# Patient Record
Sex: Female | Born: 1990 | Race: White | Hispanic: No | Marital: Married | Smoking: Never smoker
Health system: Southern US, Community
[De-identification: ages and names within clinical notes are randomized; demographics above are authoritative.]

## PROBLEM LIST (undated history)

## (undated) ENCOUNTER — Inpatient Hospital Stay (HOSPITAL_COMMUNITY): Payer: Self-pay

## (undated) DIAGNOSIS — Z8742 Personal history of other diseases of the female genital tract: Secondary | ICD-10-CM

## (undated) HISTORY — PX: DENTAL SURGERY: SHX609

## (undated) HISTORY — PX: KNEE SURGERY: SHX244

---
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
## 2013-01-22 ENCOUNTER — Encounter (HOSPITAL_COMMUNITY): Payer: Self-pay | Admitting: Emergency Medicine

## 2013-01-22 ENCOUNTER — Emergency Department (INDEPENDENT_AMBULATORY_CARE_PROVIDER_SITE_OTHER)
Admission: EM | Admit: 2013-01-22 | Discharge: 2013-01-22 | Disposition: A | Discharge status: Home / Self Care | Payer: Self-pay | Source: Home / Self Care | Attending: Family Medicine | Admitting: Family Medicine

## 2013-01-22 DIAGNOSIS — S134XXA Sprain of ligaments of cervical spine, initial encounter: Secondary | ICD-10-CM

## 2013-01-22 DIAGNOSIS — S139XXA Sprain of joints and ligaments of unspecified parts of neck, initial encounter: Secondary | ICD-10-CM

## 2013-01-22 HISTORY — DX: Personal history of other diseases of the female genital tract: Z87.42

## 2013-01-22 MED ORDER — CYCLOBENZAPRINE HCL 10 MG PO TABS: 10.0000 mg | ORAL_TABLET | Freq: Two times a day (BID) | ORAL | Status: DC | PRN
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
## 2013-01-22 NOTE — ED Provider Notes (Signed)
Medical screening examination/treatment/procedure(s) were performed by resident physician or non-physician practitioner and as supervising physician I was immediately available for consultation/collaboration.   Kala Ambriz DOUGLAS MD.   Reniah Cottingham D Essa Wenk, MD 01/22/13 1653 
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
## 2014-04-26 ENCOUNTER — Ambulatory Visit (HOSPITAL_COMMUNITY): Payer: Self-pay

## 2015-03-31 ENCOUNTER — Ambulatory Visit (INDEPENDENT_AMBULATORY_CARE_PROVIDER_SITE_OTHER): Payer: BLUE CROSS/BLUE SHIELD

## 2015-03-31 ENCOUNTER — Ambulatory Visit (INDEPENDENT_AMBULATORY_CARE_PROVIDER_SITE_OTHER): Payer: BLUE CROSS/BLUE SHIELD | Admitting: Podiatry

## 2015-03-31 VITALS — BP 118/77 | HR 98 | Resp 16 | Ht 64.5 in | Wt 185.0 lb

## 2015-03-31 DIAGNOSIS — M79671 Pain in right foot: Secondary | ICD-10-CM | POA: Diagnosis not present

## 2015-03-31 DIAGNOSIS — M779 Enthesopathy, unspecified: Secondary | ICD-10-CM

## 2015-03-31 MED ORDER — TRIAMCINOLONE ACETONIDE 10 MG/ML IJ SUSP
10.0000 mg | Freq: Once | INTRAMUSCULAR | Status: AC
  Administered 2015-03-31: 10 mg

## 2015-03-31 MED ORDER — PREDNISONE 10 MG PO TABS: ORAL_TABLET | ORAL | Status: DC

## 2015-03-31 NOTE — Progress Notes (Signed)
   Subjective:    Patient ID: Melissa Sherman, female    DOB: 1990-05-24, 25 y.o.   MRN: [id number]  HPI Patient presents with foot pain in their right foot; lateral side, dorsal & plantar. Pt stated, "entire foot hurts".   Review of Systems  Constitutional: Positive for unexpected weight change.  All other systems reviewed and are negative.      Objective:   Physical Exam        Assessment & Plan:

## 2015-04-02 NOTE — Progress Notes (Signed)
Subjective:     Patient ID: Melissa Sherman, female   DOB: 1990/06/17, 25 y.o.   MRN: [id number]  HPI patient presents with exquisite discomfort in the right foot that's been present for several months with no history of injury. It is reaching a point where it's becoming hard for her to bear weight on it and it's hurting significantly and 2 separate areas   Review of Systems  All other systems reviewed and are negative.      Objective:   Physical Exam  Constitutional: She is oriented to person, place, and time.  Cardiovascular: Intact distal pulses.   Musculoskeletal: Normal range of motion.  Neurological: She is oriented to person, place, and time.  Skin: Skin is warm.  Nursing note and vitals reviewed.  neurovascular status is found to be intact with muscle strength adequate range of motion within normal limits but patient is splinting on the right foot due to the pain that is occurring. Patient has exquisite discomfort in the sinus tarsi right and the lateral area around the peroneal insertion base of fifth metatarsal and distal to this point. The area is swollen but there is no redness or open-type areas. Patient is noted to have good digital perfusion and is well oriented 3     Assessment:     Inflammatory condition right foot sinus tarsi and lateral foot with 2 distinct areas of pain    Plan:     H&P and x-ray reviewed with patient. Today I did sinus tarsi injection right 3 mg Kenalog 5 mg Xylocaine and injected the right lateral foot 3 mg Kenalog 5 mg Xylocaine and dispensed air fracture walker with instructions on usage and ice therapy. I also placed on sterile prep DS Dosepak and reappoint again to reevaluate in 2 weeks

## 2015-04-24 ENCOUNTER — Encounter: Payer: Self-pay | Admitting: Podiatry

## 2015-04-24 ENCOUNTER — Ambulatory Visit (INDEPENDENT_AMBULATORY_CARE_PROVIDER_SITE_OTHER): Payer: BLUE CROSS/BLUE SHIELD | Admitting: Podiatry

## 2015-04-24 VITALS — BP 102/70 | HR 96 | Resp 16

## 2015-04-24 DIAGNOSIS — M779 Enthesopathy, unspecified: Secondary | ICD-10-CM | POA: Diagnosis not present

## 2015-04-24 DIAGNOSIS — M79671 Pain in right foot: Secondary | ICD-10-CM | POA: Diagnosis not present

## 2015-04-25 NOTE — Progress Notes (Signed)
Subjective:     Patient ID: Melissa Sherman, female   DOB: [birth year], 25 y.o.   MRN: [id number]  HPI patient states the boot is helping but I'm still having some pain if I been on my foot to long. Patient states that overall the pain has improved quite a bit with immobilization and medication   Review of Systems     Objective:   Physical Exam Neurovascular status intact muscle strength adequate with diminished discomfort lateral side of the foot sinus tarsi and forefoot. Boot is been worn well and she was able to take the entire steroid pack    Assessment:     Acute inflammatory condition which has improved quite a bit with conservative treatment    Plan:     Reviewed the continuation of physical therapy and boot usage with gradual reduction over the next 4 weeks. Reappoint for us to recheck

## 2015-09-04 LAB — OB RESULTS CONSOLE RPR: RPR: NONREACTIVE

## 2015-09-04 LAB — OB RESULTS CONSOLE HEPATITIS B SURFACE ANTIGEN: HEP B S AG: NEGATIVE

## 2015-09-04 LAB — OB RESULTS CONSOLE ANTIBODY SCREEN: ANTIBODY SCREEN: NEGATIVE

## 2015-09-04 LAB — OB RESULTS CONSOLE RUBELLA ANTIBODY, IGM: RUBELLA: IMMUNE

## 2015-09-04 LAB — OB RESULTS CONSOLE ABO/RH: RH Type: POSITIVE

## 2015-09-04 LAB — OB RESULTS CONSOLE HIV ANTIBODY (ROUTINE TESTING): HIV: NONREACTIVE

## 2015-09-22 LAB — OB RESULTS CONSOLE GC/CHLAMYDIA
CHLAMYDIA, DNA PROBE: NEGATIVE
GC PROBE AMP, GENITAL: NEGATIVE

## 2015-10-01 ENCOUNTER — Encounter (HOSPITAL_COMMUNITY): Payer: Self-pay

## 2015-10-01 ENCOUNTER — Inpatient Hospital Stay (HOSPITAL_COMMUNITY)
Admission: AD | Admit: 2015-10-01 | Discharge: 2015-10-01 | Disposition: A | Discharge status: Home / Self Care | Payer: BLUE CROSS/BLUE SHIELD | Source: Ambulatory Visit | Attending: Obstetrics and Gynecology | Admitting: Obstetrics and Gynecology

## 2015-10-01 DIAGNOSIS — R197 Diarrhea, unspecified: Secondary | ICD-10-CM | POA: Diagnosis present

## 2015-10-01 DIAGNOSIS — R110 Nausea: Secondary | ICD-10-CM | POA: Insufficient documentation

## 2015-10-01 DIAGNOSIS — R531 Weakness: Secondary | ICD-10-CM | POA: Insufficient documentation

## 2015-10-01 DIAGNOSIS — O210 Mild hyperemesis gravidarum: Secondary | ICD-10-CM | POA: Diagnosis present

## 2015-10-01 DIAGNOSIS — O99281 Endocrine, nutritional and metabolic diseases complicating pregnancy, first trimester: Secondary | ICD-10-CM | POA: Diagnosis not present

## 2015-10-01 DIAGNOSIS — O9989 Other specified diseases and conditions complicating pregnancy, childbirth and the puerperium: Secondary | ICD-10-CM | POA: Insufficient documentation

## 2015-10-01 DIAGNOSIS — O26899 Other specified pregnancy related conditions, unspecified trimester: Secondary | ICD-10-CM

## 2015-10-01 DIAGNOSIS — Z3A12 12 weeks gestation of pregnancy: Secondary | ICD-10-CM | POA: Diagnosis not present

## 2015-10-01 DIAGNOSIS — E282 Polycystic ovarian syndrome: Secondary | ICD-10-CM | POA: Insufficient documentation

## 2015-10-01 LAB — URINE MICROSCOPIC-ADD ON

## 2015-10-01 LAB — CBC
HEMATOCRIT: 35.1 % — AB (ref 36.0–46.0)
Hemoglobin: 12.9 g/dL (ref 12.0–15.0)
MCH: 30.9 pg (ref 26.0–34.0)
MCHC: 36.8 g/dL — ABNORMAL HIGH (ref 30.0–36.0)
MCV: 84.2 fL (ref 78.0–100.0)
Platelets: 255 10*3/uL (ref 150–400)
RBC: 4.17 MIL/uL (ref 3.87–5.11)
RDW: 12.3 % (ref 11.5–15.5)
WBC: 8.9 10*3/uL (ref 4.0–10.5)

## 2015-10-01 LAB — COMPREHENSIVE METABOLIC PANEL
ALT: 14 U/L (ref 14–54)
AST: 16 U/L (ref 15–41)
Albumin: 3.6 g/dL (ref 3.5–5.0)
Alkaline Phosphatase: 36 U/L — ABNORMAL LOW (ref 38–126)
Anion gap: 6 (ref 5–15)
BILIRUBIN TOTAL: 0.2 mg/dL — AB (ref 0.3–1.2)
BUN: 6 mg/dL (ref 6–20)
CALCIUM: 9.1 mg/dL (ref 8.9–10.3)
CO2: 24 mmol/L (ref 22–32)
CREATININE: 0.6 mg/dL (ref 0.44–1.00)
Chloride: 103 mmol/L (ref 101–111)
Glucose, Bld: 77 mg/dL (ref 65–99)
Potassium: 4 mmol/L (ref 3.5–5.1)
Sodium: 133 mmol/L — ABNORMAL LOW (ref 135–145)
TOTAL PROTEIN: 6.7 g/dL (ref 6.5–8.1)

## 2015-10-01 LAB — URINALYSIS, ROUTINE W REFLEX MICROSCOPIC
BILIRUBIN URINE: NEGATIVE
Glucose, UA: NEGATIVE mg/dL
HGB URINE DIPSTICK: NEGATIVE
KETONES UR: NEGATIVE mg/dL
NITRITE: NEGATIVE
PH: 5.5 (ref 5.0–8.0)
Protein, ur: NEGATIVE mg/dL
SPECIFIC GRAVITY, URINE: 1.01 (ref 1.005–1.030)

## 2015-10-01 MED ORDER — LACTATED RINGERS IV BOLUS (SEPSIS)
1000.0000 mL | Freq: Once | INTRAVENOUS | Status: AC
  Administered 2015-10-01: 1000 mL via INTRAVENOUS

## 2015-10-01 NOTE — Discharge Instructions (Signed)
Diarrhea  Diarrhea is watery poop (stool). It can make you feel weak, tired, thirsty, or give you a dry mouth (signs of dehydration). Watery poop is a sign of another problem, most often an infection. It often lasts 2-3 days. It can last longer if it is a sign of something serious. Take care of yourself as told by your doctor.  HOME CARE   · Drink 1 cup (8 ounces) of fluid each time you have watery poop.  · Do not drink the following fluids:    Those that contain simple sugars (fructose, glucose, galactose, lactose, sucrose, maltose).    Sports drinks.    Fruit juices.    Whole milk products.    Sodas.    Drinks with caffeine (coffee, tea, soda) or alcohol.  · Oral rehydration solution may be used if the doctor says it is okay. You may make your own solution. Follow this recipe:    ?-? teaspoon table salt.    ¾ teaspoon baking soda.    ? teaspoon salt substitute containing potassium chloride.    1 ? tablespoons sugar.    1 liter (34 ounces) of water.  · Avoid the following foods:    High fiber foods, such as raw fruits and vegetables.    Nuts, seeds, and whole grain breads and cereals.     Those that are sweetened with sugar alcohols (xylitol, sorbitol, mannitol).  · Try eating the following foods:    Starchy foods, such as rice, toast, pasta, low-sugar cereal, oatmeal, baked potatoes, crackers, and bagels.    Bananas.    Applesauce.  · Eat probiotic-rich foods, such as yogurt and milk products that are fermented.  · Wash your hands well after each time you have watery poop.  · Only take medicine as told by your doctor.  · Take a warm bath to help lessen burning or pain from having watery poop.  GET HELP RIGHT AWAY IF:   · You cannot drink fluids without throwing up (vomiting).  · You keep throwing up.  · You have blood in your poop, or your poop looks black and tarry.  · You do not pee (urinate) in 6-8 hours, or there is only a small amount of very dark pee.  · You have belly (abdominal) pain that gets worse or  stays in the same spot (localizes).  · You are weak, dizzy, confused, or light-headed.  · You have a very bad headache.  · Your watery poop gets worse or does not get better.  · You have a fever or lasting symptoms for more than 2-3 days.  · You have a fever and your symptoms suddenly get worse.  MAKE SURE YOU:   · Understand these instructions.  · Will watch your condition.  · Will get help right away if you are not doing well or get worse.     This information is not intended to replace advice given to you by your health care provider. Make sure you discuss any questions you have with your health care provider.     Document Released: 07/10/2007 Document Revised: 02/11/2014 Document Reviewed: 09/29/2011  Elsevier Interactive Patient Education ©2016 [employer]

## 2015-10-01 NOTE — MAU Note (Signed)
Pt presents to MAU with diarrhea and nausea. Symptoms started yesterday. Pt has not been able to eat much today. Has been taking Imodium and Tums, and has helped.

## 2015-10-01 NOTE — MAU Provider Note (Signed)
History     CSN: [id number]  Arrival date and time: 10/01/15 1403   First Provider Initiated Contact with Patient 10/01/15 1453       Chief Complaint  Patient presents with  . Diarrhea  . Fatigue  . Morning Sickness   Melissa Sherman is a 25 y.o. G1P0 at [redacted]w[redacted]d who presents with nausea, diarrhea, and weakness. Symptoms began Friday. Denies fever, vomiting, sick contacts, recent hospitalization or antibiotic usage, vaginal bleeding. Woke up with sharp abdominal pain Friday morning prior to diarrhea; pain has resolved since then.    Diarrhea   This is a new problem. Episode onset: 2 days ago. Episode frequency: 9 times yesterday, once today. The problem has been rapidly improving. The stool consistency is described as watery. The patient states that diarrhea does not awaken her from sleep. Associated symptoms include abdominal pain (on Friday; abdominal soreness today). Pertinent negatives include no bloating, chills, fever, increased  flatus, myalgias or vomiting. Nothing aggravates the symptoms. There are no known risk factors. Treatments tried: imodium. The treatment provided moderate relief. There is no history of bowel resection, inflammatory bowel disease, irritable bowel syndrome or a recent abdominal surgery.     OB History    Gravida Para Term Preterm AB Living   1             SAB TAB Ectopic Multiple Live Births                  Past Medical History:  Diagnosis Date  . History of PCOS     Past Surgical History:  Procedure Laterality Date  . DENTAL SURGERY    . KNEE SURGERY      No family history on file.  Social History  Substance Use Topics  . Smoking status: Never Smoker  . Smokeless tobacco: Never Used  . Alcohol use No    Allergies:  Allergies  Allergen Reactions  . Nsaids   . Diphenhydramine Swelling    Other reaction(s): Hives  . Penicillins Other (See Comments)    Stomach pains    Prescriptions Prior to Admission  Medication Sig Dispense  Refill Last Dose  . Loperamide HCl (IMODIUM PO) Take 1 tablet by mouth.   10/01/2015 at 0700  . Prenatal Vit-Fe Fumarate-FA (MULTIVITAMIN-PRENATAL) 27-0.8 MG TABS tablet Take 1 tablet by mouth daily at 12 noon.   09/30/2015 at Unknown time  . valACYclovir (VALTREX) 1000 MG tablet    Past Week at Unknown time    Review of Systems  Constitutional: Negative for chills and fever.  Gastrointestinal: Positive for abdominal pain (on Friday; abdominal soreness today), diarrhea and nausea. Negative for bloating, blood in stool, flatus, melena and vomiting.  Genitourinary: Negative.  Negative for dysuria and frequency.  Musculoskeletal: Negative for myalgias.  Neurological: Positive for weakness. Negative for dizziness.   Physical Exam   Blood pressure (!) 103/54, pulse 75, temperature 99.2 F (37.3 C), temperature source Oral, resp. rate 18, height 5\' 5"  (1.651 m), weight 174 lb (78.9 kg), last menstrual period 07/08/2015.  Physical Exam  Nursing note and vitals reviewed. Constitutional: She is oriented to person, place, and time. She appears well-developed and well-nourished. No distress.  HENT:  Head: Normocephalic and atraumatic.  Eyes: Conjunctivae are normal. Right eye exhibits no discharge. Left eye exhibits no discharge. No scleral icterus.  Neck: Normal range of motion.  Cardiovascular: Normal rate, regular rhythm and normal heart sounds.   No murmur heard. Respiratory: Effort normal and breath sounds normal.  No respiratory distress. She has no wheezes.  GI: Soft. Bowel sounds are normal. She exhibits no distension. There is no tenderness. There is no rebound and no guarding.  Neurological: She is alert and oriented to person, place, and time.  Skin: Skin is warm and dry. She is not diaphoretic.  Psychiatric: She has a normal mood and affect. Her behavior is normal. Judgment and thought content normal.    MAU Course  Procedures Results for orders placed or performed during the  hospital encounter of 10/01/15 (from the past 24 hour(s))  Urinalysis, Routine w reflex microscopic ([facility])     Status: Abnormal   Collection Time: 10/01/15  2:15 PM  Result Value Ref Range   Color, Urine YELLOW YELLOW   APPearance CLEAR CLEAR   Specific Gravity, Urine 1.010 1.005 - 1.030   pH 5.5 5.0 - 8.0   Glucose, UA NEGATIVE NEGATIVE mg/dL   Hgb urine dipstick NEGATIVE NEGATIVE   Bilirubin Urine NEGATIVE NEGATIVE   Ketones, ur NEGATIVE NEGATIVE mg/dL   Protein, ur NEGATIVE NEGATIVE mg/dL   Nitrite NEGATIVE NEGATIVE   Leukocytes, UA TRACE (A) NEGATIVE  Urine microscopic-add on     Status: Abnormal   Collection Time: 10/01/15  2:15 PM  Result Value Ref Range   Squamous Epithelial / LPF 0-5 (A) NONE SEEN   WBC, UA 0-5 0 - 5 WBC/hpf   RBC / HPF 0-5 0 - 5 RBC/hpf   Bacteria, UA FEW (A) NONE SEEN  CBC     Status: Abnormal   Collection Time: 10/01/15  3:06 PM  Result Value Ref Range   WBC 8.9 4.0 - 10.5 K/uL   RBC 4.17 3.87 - 5.11 MIL/uL   Hemoglobin 12.9 12.0 - 15.0 g/dL   HCT 16.135.1 (L) 09.636.0 - 04.546.0 %   MCV 84.2 78.0 - 100.0 fL   MCH 30.9 26.0 - 34.0 pg   MCHC 36.8 (H) 30.0 - 36.0 g/dL   RDW 40.912.3 81.111.5 - 91.415.5 %   Platelets 255 150 - 400 K/uL  Comprehensive metabolic panel     Status: Abnormal   Collection Time: 10/01/15  3:06 PM  Result Value Ref Range   Sodium 133 (L) 135 - 145 mmol/L   Potassium 4.0 3.5 - 5.1 mmol/L   Chloride 103 101 - 111 mmol/L   CO2 24 22 - 32 mmol/L   Glucose, Bld 77 65 - 99 mg/dL   BUN 6 6 - 20 mg/dL   Creatinine, Ser 7.820.60 0.44 - 1.00 mg/dL   Calcium 9.1 8.9 - 95.610.3 mg/dL   Total Protein 6.7 6.5 - 8.1 g/dL   Albumin 3.6 3.5 - 5.0 g/dL   AST 16 15 - 41 U/L   ALT 14 14 - 54 U/L   Alkaline Phosphatase 36 (L) 38 - 126 U/L   Total Bilirubin 0.2 (L) 0.3 - 1.2 mg/dL   GFR calc non Af Amer >60 >60 mL/min   GFR calc Af Amer >60 >60 mL/min   Anion gap 6 5 - 15    MDM FHT 152 by doppler IV fluid bolus, LR CBC, CMP VSS S/w Dr. Renaldo FiddlerAdkins  regarding patient - ok to discharge home. D/c imodium Assessment and Plan  A: 1. Diarrhea, unspecified type   2. Pregnancy related nausea, antepartum     P: Discharge home Stop taking imodium unless diarrhea returns Keep f/u with OB Discussed appropriate diet for diarrhea  Judeth Hornrin Quinta Eimer 10/01/2015, 2:52 PM

## 2016-02-05 NOTE — L&D Delivery Note (Signed)
Delivery Note At 10:35 PM a viable female was delivered via Vaginal, Spontaneous Delivery (Presentation: ;  ).  APGAR: 7, 9; weight  .   Placenta statusspont>>intact: , .  Cord:  with the following complications: TRUR KNOT X 1.  Cord pH: not sent  Anesthesia:  epid + local Episiotomy:  none Lacerations:  Sec deg Suture Repair: 3.0 vicryl rapide Est. Blood Loss (mL):  200  Mom to postpartum.  Baby to Couplet care / Skin to Skin.  Nihar Klus M 04/08/2016, 11:01 PM

## 2016-03-08 LAB — OB RESULTS CONSOLE GBS: GBS: NEGATIVE

## 2016-04-08 ENCOUNTER — Inpatient Hospital Stay (HOSPITAL_COMMUNITY)
Admission: AD | Admit: 2016-04-08 | Discharge: 2016-04-10 | DRG: 775 | Disposition: A | Discharge status: Home / Self Care | Payer: BLUE CROSS/BLUE SHIELD | Source: Ambulatory Visit | Attending: Obstetrics and Gynecology | Admitting: Obstetrics and Gynecology

## 2016-04-08 ENCOUNTER — Inpatient Hospital Stay (HOSPITAL_COMMUNITY): Payer: BLUE CROSS/BLUE SHIELD | Admitting: Anesthesiology

## 2016-04-08 ENCOUNTER — Encounter (HOSPITAL_COMMUNITY): Payer: Self-pay

## 2016-04-08 DIAGNOSIS — Z3490 Encounter for supervision of normal pregnancy, unspecified, unspecified trimester: Secondary | ICD-10-CM

## 2016-04-08 DIAGNOSIS — Z3493 Encounter for supervision of normal pregnancy, unspecified, third trimester: Secondary | ICD-10-CM | POA: Diagnosis present

## 2016-04-08 DIAGNOSIS — Z3A40 40 weeks gestation of pregnancy: Secondary | ICD-10-CM | POA: Diagnosis not present

## 2016-04-08 LAB — CBC
HCT: 33.2 % — ABNORMAL LOW (ref 36.0–46.0)
Hemoglobin: 11.6 g/dL — ABNORMAL LOW (ref 12.0–15.0)
MCH: 30.3 pg (ref 26.0–34.0)
MCHC: 34.9 g/dL (ref 30.0–36.0)
MCV: 86.7 fL (ref 78.0–100.0)
PLATELETS: 295 10*3/uL (ref 150–400)
RBC: 3.83 MIL/uL — ABNORMAL LOW (ref 3.87–5.11)
RDW: 13.4 % (ref 11.5–15.5)
WBC: 12 10*3/uL — AB (ref 4.0–10.5)

## 2016-04-08 LAB — TYPE AND SCREEN
ABO/RH(D): A POS
ANTIBODY SCREEN: NEGATIVE

## 2016-04-08 LAB — ABO/RH: ABO/RH(D): A POS

## 2016-04-08 MED ORDER — LACTATED RINGERS IV SOLN: 500.0000 mL | INTRAVENOUS | Status: DC | PRN

## 2016-04-08 MED ORDER — LACTATED RINGERS IV SOLN
500.0000 mL | Freq: Once | INTRAVENOUS | Status: AC
  Administered 2016-04-08: 500 mL via INTRAVENOUS

## 2016-04-08 MED ORDER — FLEET ENEMA 7-19 GM/118ML RE ENEM: 1.0000 | ENEMA | RECTAL | Status: DC | PRN

## 2016-04-08 MED ORDER — OXYTOCIN 40 UNITS IN LACTATED RINGERS INFUSION - SIMPLE MED: 2.5000 [IU]/h | INTRAVENOUS | Status: DC

## 2016-04-08 MED ORDER — BUTORPHANOL TARTRATE 1 MG/ML IJ SOLN: 1.0000 mg | Freq: Once | INTRAMUSCULAR | Status: DC

## 2016-04-08 MED ORDER — LIDOCAINE HCL (PF) 1 % IJ SOLN: 30.0000 mL | INTRAMUSCULAR | Status: DC | PRN

## 2016-04-08 MED ORDER — LACTATED RINGERS IV SOLN
INTRAVENOUS | Status: DC
  Administered 2016-04-08: 14:00:00 via INTRAVENOUS

## 2016-04-08 MED ORDER — LIDOCAINE HCL (PF) 1 % IJ SOLN
INTRAMUSCULAR | Status: DC | PRN
  Administered 2016-04-08 (×2): 4 mL via EPIDURAL

## 2016-04-08 MED ORDER — OXYTOCIN BOLUS FROM INFUSION: 500.0000 mL | Freq: Once | INTRAVENOUS | Status: DC

## 2016-04-08 MED ORDER — EPHEDRINE 5 MG/ML INJ
10.0000 mg | INTRAVENOUS | Status: DC | PRN
  Filled 2016-04-08: qty 4

## 2016-04-08 MED ORDER — OXYCODONE-ACETAMINOPHEN 5-325 MG PO TABS: 2.0000 | ORAL_TABLET | ORAL | Status: DC | PRN

## 2016-04-08 MED ORDER — ONDANSETRON HCL 4 MG/2ML IJ SOLN: 4.0000 mg | Freq: Four times a day (QID) | INTRAMUSCULAR | Status: DC | PRN

## 2016-04-08 MED ORDER — OXYCODONE-ACETAMINOPHEN 5-325 MG PO TABS: 1.0000 | ORAL_TABLET | ORAL | Status: DC | PRN

## 2016-04-08 MED ORDER — BUTORPHANOL TARTRATE 1 MG/ML IJ SOLN
INTRAMUSCULAR | Status: AC
  Administered 2016-04-08: 1 mg
  Filled 2016-04-08: qty 1

## 2016-04-08 MED ORDER — LIDOCAINE HCL (PF) 1 % IJ SOLN
30.0000 mL | INTRAMUSCULAR | Status: DC | PRN
  Administered 2016-04-08: 30 mL via SUBCUTANEOUS
  Filled 2016-04-08: qty 30

## 2016-04-08 MED ORDER — PHENYLEPHRINE 40 MCG/ML (10ML) SYRINGE FOR IV PUSH (FOR BLOOD PRESSURE SUPPORT)
80.0000 ug | PREFILLED_SYRINGE | INTRAVENOUS | Status: DC | PRN
  Filled 2016-04-08: qty 5

## 2016-04-08 MED ORDER — ACETAMINOPHEN 325 MG PO TABS: 650.0000 mg | ORAL_TABLET | ORAL | Status: DC | PRN

## 2016-04-08 MED ORDER — LACTATED RINGERS IV SOLN: INTRAVENOUS | Status: DC

## 2016-04-08 MED ORDER — SOD CITRATE-CITRIC ACID 500-334 MG/5ML PO SOLN: 30.0000 mL | ORAL | Status: DC | PRN

## 2016-04-08 MED ORDER — FENTANYL 2.5 MCG/ML BUPIVACAINE 1/10 % EPIDURAL INFUSION (WH - ANES)
14.0000 mL/h | INTRAMUSCULAR | Status: DC | PRN
  Administered 2016-04-08 (×2): 14 mL/h via EPIDURAL
  Filled 2016-04-08 (×2): qty 100

## 2016-04-08 MED ORDER — OXYTOCIN 40 UNITS IN LACTATED RINGERS INFUSION - SIMPLE MED
2.5000 [IU]/h | INTRAVENOUS | Status: DC
  Administered 2016-04-08: 2.5 [IU]/h via INTRAVENOUS
  Filled 2016-04-08: qty 1000

## 2016-04-08 MED ORDER — ACETAMINOPHEN 325 MG PO TABS
650.0000 mg | ORAL_TABLET | ORAL | Status: DC | PRN
  Administered 2016-04-08: 650 mg via ORAL
  Filled 2016-04-08 (×2): qty 2

## 2016-04-08 MED ORDER — OXYTOCIN BOLUS FROM INFUSION
500.0000 mL | Freq: Once | INTRAVENOUS | Status: AC
  Administered 2016-04-08: 500 mL via INTRAVENOUS

## 2016-04-08 MED ORDER — PHENYLEPHRINE 40 MCG/ML (10ML) SYRINGE FOR IV PUSH (FOR BLOOD PRESSURE SUPPORT)
80.0000 ug | PREFILLED_SYRINGE | INTRAVENOUS | Status: DC | PRN
  Filled 2016-04-08: qty 10
  Filled 2016-04-08: qty 5

## 2016-04-08 NOTE — Anesthesia Preprocedure Evaluation (Signed)
Anesthesia Evaluation  Patient identified by MRN, date of birth, ID band Patient awake    Reviewed: Allergy & Precautions, Patient's Chart, lab work & pertinent test results  Airway Mallampati: III       Dental no notable dental hx. (+) Teeth Intact   Pulmonary neg pulmonary ROS,    Pulmonary exam normal breath sounds clear to auscultation       Cardiovascular negative cardio ROS Normal cardiovascular exam Rhythm:Regular Rate:Normal     Neuro/Psych negative neurological ROS  negative psych ROS   GI/Hepatic Neg liver ROS, GERD  ,  Endo/Other  Morbid obesityHx/o PCOS  Renal/GU negative Renal ROS  negative genitourinary   Musculoskeletal negative musculoskeletal ROS (+)   Abdominal (+) + obese,   Peds  Hematology  (+) anemia ,   Anesthesia Other Findings   Reproductive/Obstetrics                             Anesthesia Physical Anesthesia Plan  ASA: III  Anesthesia Plan: Epidural   Post-op Pain Management:    Induction:   Airway Management Planned: Natural Airway  Additional Equipment:   Intra-op Plan:   Post-operative Plan:   Informed Consent: I have reviewed the patients History and Physical, chart, labs and discussed the procedure including the risks, benefits and alternatives for the proposed anesthesia with the patient or authorized representative who has indicated his/her understanding and acceptance.     Plan Discussed with: Anesthesiologist  Anesthesia Plan Comments:         Anesthesia Quick Evaluation

## 2016-04-08 NOTE — MAU Note (Signed)
Patient reports to mau with c/o contractions; was sent over from the office; she endorses being told she was 4-5 cm/80%. Rating pain 10/10--wants an epidural. GBS Neg. Denies LOF, VB at this time. +FM

## 2016-04-08 NOTE — Progress Notes (Signed)
6-7/ AROM>.thin mec, FHR cat I, comft w./ epid

## 2016-04-08 NOTE — H&P (Signed)
Melissa Sherman  DICTATION # [id number] CSN# [id number]   [name],[name], MD 04/08/2016 1:14 PM

## 2016-04-08 NOTE — Anesthesia Procedure Notes (Signed)
Epidural Patient location during procedure: OB Start time: 04/08/2016 2:19 PM  Staffing Anesthesiologist: Mal AmabileFOSTER, Rakiyah Esch Performed: anesthesiologist   Preanesthetic Checklist Completed: patient identified, site marked, surgical consent, pre-op evaluation, timeout performed, IV checked, risks and benefits discussed and monitors and equipment checked  Epidural Patient position: sitting Prep: site prepped and draped and DuraPrep Patient monitoring: continuous pulse ox and blood pressure Approach: midline Location: L3-L4 Injection technique: LOR air  Needle:  Needle type: Tuohy  Needle gauge: 17 G Needle length: 9 cm and 9 Needle insertion depth: 9 cm Catheter type: closed end flexible Catheter size: 19 Gauge Catheter at skin depth: 14 cm Test dose: negative and Other  Assessment Events: blood not aspirated, injection not painful, no injection resistance, negative IV test and no paresthesia  Additional Notes Patient identified. Risks and benefits discussed including failed block, incomplete  Pain control, post dural puncture headache, nerve damage, paralysis, blood pressure Changes, nausea, vomiting, reactions to medications-both toxic and allergic and post Partum back pain. All questions were answered. Patient expressed understanding and wished to proceed. Sterile technique was used throughout procedure. Epidural site was Dressed with sterile barrier dressing. No paresthesias, signs of intravascular injection Or signs of intrathecal spread were encountered.  Patient was more comfortable after the epidural was dosed. Please see RN's note for documentation of vital signs and FHR which are stable.

## 2016-04-08 NOTE — H&P (Signed)
NAMJefm Sherman:  Willhelm, RAHCEL                ACCOUNT NO.:  [id number]  MEDICAL RECORD NO.:  [id number]  LOCATION:                                 FACILITY:  PHYSICIAN:  Duke Salviaichard M. Marcelle OverlieHolland, M.D.    DATE OF BIRTH:  DATE OF ADMISSION:  04/08/2016 DATE OF DISCHARGE:                             HISTORY & PHYSICAL   CHIEF COMPLAINT:  Labor at term.  HISTORY OF PRESENT ILLNESS:  A [age] year old G1, P0, seen in the office today, was noted to be 4, 80%, -2, after having a closed cervix last week.  GBS is negative.  Presents now in active labor. Blood type is A positive.  PAST MEDICAL HISTORY:  ALLERGIES:  Penicillin, NSAIDs.  REVIEW OF SYSTEMS:  Significant for history of PCOS.  For remainder of her family and social history, please see the [facility] form for details.  PHYSICAL EXAMINATION:  VITAL SIGNS:  Temp 98.2, blood pressure 120/78. HEENT:  Unremarkable. NECK:  Supple without masses. LUNGS:  Clear. CARDIOVASCULAR:  Regular rate and rhythm.  No murmurs, rubs, gallops. BREASTS:  Not examined. GU:  Term fundal height.  Fetal heart rate was 140.  Cervix was 4-5, 80%, vertex, -2.  PLAN:  We will admit for labor and request epidural.     Duke Salviaichard M. Marcelle OverlieHolland, M.D.   ______________________________ Duke Salviaichard M. Marcelle OverlieHolland, M.D.    [facility]MED[facility]  D:  04/08/2016  T:  04/08/2016  Job:  [id number]

## 2016-04-09 ENCOUNTER — Encounter (HOSPITAL_COMMUNITY): Payer: Self-pay

## 2016-04-09 LAB — CBC
HEMATOCRIT: 30.6 % — AB (ref 36.0–46.0)
Hemoglobin: 10.7 g/dL — ABNORMAL LOW (ref 12.0–15.0)
MCH: 30.4 pg (ref 26.0–34.0)
MCHC: 35 g/dL (ref 30.0–36.0)
MCV: 86.9 fL (ref 78.0–100.0)
Platelets: 252 10*3/uL (ref 150–400)
RBC: 3.52 MIL/uL — ABNORMAL LOW (ref 3.87–5.11)
RDW: 13.7 % (ref 11.5–15.5)
WBC: 16.4 10*3/uL — ABNORMAL HIGH (ref 4.0–10.5)

## 2016-04-09 LAB — RPR: RPR Ser Ql: NONREACTIVE

## 2016-04-09 MED ORDER — MEASLES, MUMPS & RUBELLA VAC ~~LOC~~ INJ
0.5000 mL | INJECTION | Freq: Once | SUBCUTANEOUS | Status: DC
  Filled 2016-04-09: qty 0.5

## 2016-04-09 MED ORDER — SIMETHICONE 80 MG PO CHEW: 80.0000 mg | CHEWABLE_TABLET | ORAL | Status: DC | PRN

## 2016-04-09 MED ORDER — ONDANSETRON HCL 4 MG/2ML IJ SOLN: 4.0000 mg | INTRAMUSCULAR | Status: DC | PRN

## 2016-04-09 MED ORDER — BENZOCAINE-MENTHOL 20-0.5 % EX AERO
1.0000 "application " | INHALATION_SPRAY | CUTANEOUS | Status: DC | PRN
  Administered 2016-04-09 – 2016-04-10 (×2): 1 via TOPICAL
  Filled 2016-04-09 (×2): qty 56

## 2016-04-09 MED ORDER — OXYCODONE-ACETAMINOPHEN 5-325 MG PO TABS
2.0000 | ORAL_TABLET | ORAL | Status: DC | PRN
  Administered 2016-04-09: 2 via ORAL
  Filled 2016-04-09: qty 2

## 2016-04-09 MED ORDER — IBUPROFEN 800 MG PO TABS: 800.0000 mg | ORAL_TABLET | Freq: Three times a day (TID) | ORAL | Status: DC | PRN

## 2016-04-09 MED ORDER — ONDANSETRON HCL 4 MG PO TABS: 4.0000 mg | ORAL_TABLET | ORAL | Status: DC | PRN

## 2016-04-09 MED ORDER — BISACODYL 10 MG RE SUPP: 10.0000 mg | Freq: Every day | RECTAL | Status: DC | PRN

## 2016-04-09 MED ORDER — DIBUCAINE 1 % RE OINT: 1.0000 "application " | TOPICAL_OINTMENT | RECTAL | Status: DC | PRN

## 2016-04-09 MED ORDER — DIPHENHYDRAMINE HCL 25 MG PO CAPS: 25.0000 mg | ORAL_CAPSULE | Freq: Four times a day (QID) | ORAL | Status: DC | PRN

## 2016-04-09 MED ORDER — ACETAMINOPHEN 325 MG PO TABS
650.0000 mg | ORAL_TABLET | ORAL | Status: DC | PRN
  Administered 2016-04-09: 650 mg via ORAL

## 2016-04-09 MED ORDER — PRENATAL MULTIVITAMIN CH
1.0000 | ORAL_TABLET | Freq: Every day | ORAL | Status: DC
  Administered 2016-04-09 – 2016-04-10 (×2): 1 via ORAL
  Filled 2016-04-09 (×2): qty 1

## 2016-04-09 MED ORDER — FLEET ENEMA 7-19 GM/118ML RE ENEM: 1.0000 | ENEMA | Freq: Every day | RECTAL | Status: DC | PRN

## 2016-04-09 MED ORDER — WITCH HAZEL-GLYCERIN EX PADS
1.0000 | MEDICATED_PAD | CUTANEOUS | Status: DC | PRN
Start: 2016-04-09 — End: 2016-04-10

## 2016-04-09 MED ORDER — TETANUS-DIPHTH-ACELL PERTUSSIS 5-2.5-18.5 LF-MCG/0.5 IM SUSP: 0.5000 mL | Freq: Once | INTRAMUSCULAR | Status: DC

## 2016-04-09 MED ORDER — SENNOSIDES-DOCUSATE SODIUM 8.6-50 MG PO TABS
2.0000 | ORAL_TABLET | ORAL | Status: DC
  Administered 2016-04-10: 2 via ORAL
  Filled 2016-04-09: qty 2

## 2016-04-09 MED ORDER — OXYCODONE-ACETAMINOPHEN 5-325 MG PO TABS
1.0000 | ORAL_TABLET | ORAL | Status: DC | PRN
  Administered 2016-04-09 – 2016-04-10 (×4): 1 via ORAL
  Filled 2016-04-09 (×5): qty 1

## 2016-04-09 MED ORDER — COCONUT OIL OIL
1.0000 "application " | TOPICAL_OIL | Status: DC | PRN
  Filled 2016-04-09: qty 120

## 2016-04-09 MED ORDER — ZOLPIDEM TARTRATE 5 MG PO TABS: 5.0000 mg | ORAL_TABLET | Freq: Every evening | ORAL | Status: DC | PRN

## 2016-04-09 NOTE — Anesthesia Postprocedure Evaluation (Signed)
Anesthesia Post Note  Patient: [name]  Procedure(s) Performed: * No procedures listed *  Patient location during evaluation: Mother Baby Anesthesia Type: Epidural Level of consciousness: awake and alert and oriented Pain management: pain level controlled Vital Signs Assessment: post-procedure vital signs reviewed and stable Respiratory status: spontaneous breathing and nonlabored ventilation Cardiovascular status: stable Postop Assessment: no headache, epidural receding, patient able to bend at knees, adequate PO intake, no backache and no signs of nausea or vomiting Anesthetic complications: no        Last Vitals:  Vitals:   04/09/16 0200 04/09/16 0557  BP: 114/63 131/75  Pulse: 85 77  Resp: 18 18  Temp: 36.9 C 36.6 C    Last Pain:  Vitals:   04/09/16 0810  TempSrc:   PainSc: 3    Pain Goal: Patients Stated Pain Goal: 0 (04/08/16 1246)               Laban EmperorMalinova,Lazlo Tunney Hristova

## 2016-04-09 NOTE — Lactation Note (Signed)
This note was copied from a baby's chart. Lactation Consultation Note; Mom asked for assist with latch. Reports LC came in at 6 am but she was too sleepy to really understand all she said and she really wants this to work out. Mom reports she has been doing skin to skin but baby hasn't really latched. Previous LC gave her a NS. Baby waking so offered assist with latch at this time. Mom agreeable. Unwrapped baby, assisted mom with football hold. Nipples are flat but areola compressible.  We were able to latch baby without NS. She was on and off the breast some but mom states this is the best she has done. Mom reports no pain with latch. Mom still trying as I left room. Encouragement given. Encouraged to rest whenever baby rests. No questions at present.   Patient Name: Melissa Sherman RUEAV'WToday's Date: 04/09/2016 Reason for consult: Follow-up assessment   Maternal Data Formula Feeding for Exclusion: No Has patient been taught Hand Expression?: Yes Does the patient have breastfeeding experience prior to this delivery?: No  Feeding Feeding Type: Breast Fed  LATCH Score/Interventions Latch: Repeated attempts needed to sustain latch, nipple held in mouth throughout feeding, stimulation needed to elicit sucking reflex.  Audible Swallowing: A few with stimulation  Type of Nipple: Flat  Comfort (Breast/Nipple): Soft / non-tender     Hold (Positioning): Assistance needed to correctly position infant at breast and maintain latch. Intervention(s): Breastfeeding basics reviewed  LATCH Score: 6  Lactation Tools Discussed/Used Tools: Nipple Dorris CarnesShields;Pump Nipple shield size: 16 Breast pump type: Double-Electric Breast Pump WIC Program: No   Consult Status Consult Status: Follow-up Date: 04/10/16 Follow-up type: In-patient    Pamelia HoitWeeks, Haitham Dolinsky D 04/09/2016, 3:52 PM

## 2016-04-09 NOTE — Lactation Note (Signed)
This note was copied from a baby's chart. Lactation Consultation Note New mom has PCOS, flat small asymmetrical nipples, asymmetrical breast. Rt. Smaller than Left. Mom is red head, light skin. Hand expression w/clear thin colostrum noted. Gave mom bullet for colostrum, mom demonstrated hand expression.  Fitted mom w/#16 NS. Areola/nipple not compressible for deep latch. Mom has generalized edema to LE. Taught application, mom demonstrated application. Shells given to wear in bra. Hand pump given to pre-pump to evert nipple prior to applying NS.  Mom shown how to use DEBP & how to disassemble, clean, & reassemble parts. Mom knows to pump q3h for 15-20 min.   Mom encouraged to feed baby 8-12 times/24 hours and with feeding cues. Newborn behavior and feeding habits discussed. Encouraged STS, I&O. Discussed cluster feeding, supply and demand.  Mom took classes at [facility] on BF. Has personal DEBP. Has support person at bedside.  Baby has bruised face. Discussed jaundice. Baby spitty, gagging while in rm. Demonstrated bulb suction. WH/LC brochure given w/resources, support groups and LC services. Patient Name: Girl Alycia PattenRachel Crumrine ZOXWR'UToday's Date: 04/09/2016 Reason for consult: Initial assessment   Maternal Data Has patient been taught Hand Expression?: Yes Does the patient have breastfeeding experience prior to this delivery?: No  Feeding Feeding Type: Breast Fed Length of feed: 15 min  LATCH Score/Interventions Latch: Repeated attempts needed to sustain latch, nipple held in mouth throughout feeding, stimulation needed to elicit sucking reflex. Intervention(s): Adjust position;Assist with latch;Breast massage;Breast compression  Audible Swallowing: A few with stimulation Intervention(s): Skin to skin;Hand expression  Type of Nipple: Flat Intervention(s): Shells;Hand pump;Double electric pump  Comfort (Breast/Nipple): Soft / non-tender     Hold (Positioning): Assistance needed to correctly  position infant at breast and maintain latch. Intervention(s): Breastfeeding basics reviewed;Support Pillows;Position options;Skin to skin  LATCH Score: 6  Lactation Tools Discussed/Used Tools: Shells;Nipple Shields;Pump;Flanges Nipple shield size: 16 Flange Size: Other (comment) (21) Shell Type: Inverted Breast pump type: Double-Electric Breast Pump Pump Review: Setup, frequency, and cleaning;Milk Storage Initiated by:: Peri JeffersonL. Phoenyx Paulsen RN IBCLC Date initiated:: 04/09/16   Consult Status Consult Status: Follow-up Date: 04/09/16 Follow-up type: In-patient    Charyl DancerCARVER, Kevontae Burgoon G 04/09/2016, 6:22 AM

## 2016-04-09 NOTE — Progress Notes (Signed)
Post Partum Day 1 Subjective: no complaints, up ad lib, voiding and tolerating PO  Objective: Blood pressure 131/75, pulse 77, temperature 97.9 F (36.6 C), temperature source Oral, resp. rate 18, height 5' 4.5" (1.638 m), weight 239 lb (108.4 kg), last menstrual period 07/08/2015, SpO2 98 %, unknown if currently breastfeeding.  Physical Exam:  General: alert, cooperative, appears stated age and no distress Lochia: appropriate Uterine Fundus: firm Incision: healing well DVT Evaluation: No evidence of DVT seen on physical exam.   Recent Labs  04/08/16 1330 04/09/16 0557  HGB 11.6* 10.7*  HCT 33.2* 30.6*    Assessment/Plan: Plan for discharge tomorrow and Breastfeeding   LOS: 1 day   Cassia Fein C 04/09/2016, 9:14 AM

## 2016-04-10 MED ORDER — FERROUS SULFATE 325 (65 FE) MG PO TBEC: 325.0000 mg | DELAYED_RELEASE_TABLET | Freq: Two times a day (BID) | ORAL | 2 refills | Status: AC

## 2016-04-10 MED ORDER — ACETAMINOPHEN 500 MG PO TABS: 500.0000 mg | ORAL_TABLET | Freq: Four times a day (QID) | ORAL | 0 refills | Status: AC | PRN

## 2016-04-10 MED ORDER — OXYCODONE HCL 5 MG PO TABS: 5.0000 mg | ORAL_TABLET | ORAL | 0 refills | Status: AC | PRN

## 2016-04-10 MED ORDER — DOCUSATE SODIUM 100 MG PO CAPS: 100.0000 mg | ORAL_CAPSULE | Freq: Two times a day (BID) | ORAL | 2 refills | Status: AC

## 2016-04-10 NOTE — Discharge Summary (Signed)
Obstetric Discharge Summary Reason for Admission: onset of labor Prenatal Procedures: none Intrapartum Procedures: spontaneous vaginal delivery Postpartum Procedures: none Complications-Operative and Postpartum: none Hemoglobin  Date Value Ref Range Status  04/09/2016 10.7 (L) 12.0 - 15.0 g/dL Final   HCT  Date Value Ref Range Status  04/09/2016 30.6 (L) 36.0 - 46.0 % Final    Physical Exam:  General: alert, cooperative and appears stated age 26: appropriate Uterine Fundus: firm Incision: N/A DVT Evaluation: No evidence of DVT seen on physical exam. Negative Homan's sign. No cords or calf tenderness. No significant calf/ankle edema.  Discharge Diagnoses: Term Pregnancy-delivered  Discharge Information: Date: 04/10/2016 Activity: pelvic rest Diet: routine Medications: Ibuprofen, Colace and Iron Condition: stable Instructions: refer to practice specific booklet Discharge to: home   Newborn Data: Live born female  Birth Weight: 7 lb 0.2 oz (3181 g) APGAR: 7, 9  Home with mother.  Melissa Sherman 04/10/2016, 9:15 AM

## 2016-04-10 NOTE — Lactation Note (Signed)
This note was copied from a baby's chart. Lactation Consultation Note  Mom' breasts are filling. Feeding observed with many swallows heard. Hand expression reviewed. Explained need for post-pumping and weight check while using nipple shield and to support milk supply. OP appointment scheduled. Showed FOB how to spoon feed.  Information on support groups reviewed.  Patient Name: Melissa Alycia PattenRachel Sherman JYNWG'NToday's Date: 04/10/2016     Maternal Data    Feeding Feeding Type: Breast Fed Length of feed: 0 min (sleepy)  LATCH Score/Interventions Latch: Repeated attempts needed to sustain latch, nipple held in mouth throughout feeding, stimulation needed to elicit sucking reflex.  Audible Swallowing: Spontaneous and intermittent (many with stimulation)  Type of Nipple: Flat  Comfort (Breast/Nipple): Soft / non-tender     Hold (Positioning): Assistance needed to correctly position infant at breast and maintain latch.  LATCH Score: 7  Lactation Tools Discussed/Used     Consult Status Consult Status: Follow-up Follow-up type: Out-patient    Soyla DryerJoseph, Eriko Economos 04/10/2016, 11:24 AM

## 2017-04-01 DIAGNOSIS — J069 Acute upper respiratory infection, unspecified: Secondary | ICD-10-CM | POA: Diagnosis not present

## 2017-05-05 ENCOUNTER — Ambulatory Visit
Admission: RE | Admit: 2017-05-05 | Discharge: 2017-05-05 | Disposition: A | Discharge status: Home / Self Care | Payer: BLUE CROSS/BLUE SHIELD | Source: Ambulatory Visit | Attending: Physician Assistant | Admitting: Physician Assistant

## 2017-05-05 ENCOUNTER — Other Ambulatory Visit: Payer: Self-pay | Admitting: Physician Assistant

## 2017-05-05 DIAGNOSIS — R05 Cough: Secondary | ICD-10-CM | POA: Diagnosis not present

## 2017-05-05 DIAGNOSIS — R059 Cough, unspecified: Secondary | ICD-10-CM

## 2017-05-17 DIAGNOSIS — F4323 Adjustment disorder with mixed anxiety and depressed mood: Secondary | ICD-10-CM | POA: Diagnosis not present

## 2017-05-29 DIAGNOSIS — F4323 Adjustment disorder with mixed anxiety and depressed mood: Secondary | ICD-10-CM | POA: Diagnosis not present

## 2017-06-02 DIAGNOSIS — F439 Reaction to severe stress, unspecified: Secondary | ICD-10-CM | POA: Diagnosis not present

## 2017-06-02 DIAGNOSIS — R05 Cough: Secondary | ICD-10-CM | POA: Diagnosis not present

## 2017-06-02 DIAGNOSIS — R7303 Prediabetes: Secondary | ICD-10-CM | POA: Diagnosis not present

## 2017-06-02 DIAGNOSIS — B001 Herpesviral vesicular dermatitis: Secondary | ICD-10-CM | POA: Diagnosis not present

## 2017-06-09 DIAGNOSIS — F4323 Adjustment disorder with mixed anxiety and depressed mood: Secondary | ICD-10-CM | POA: Diagnosis not present

## 2017-06-13 DIAGNOSIS — F4323 Adjustment disorder with mixed anxiety and depressed mood: Secondary | ICD-10-CM | POA: Diagnosis not present

## 2017-06-17 DIAGNOSIS — F4323 Adjustment disorder with mixed anxiety and depressed mood: Secondary | ICD-10-CM | POA: Diagnosis not present

## 2017-06-24 DIAGNOSIS — F4323 Adjustment disorder with mixed anxiety and depressed mood: Secondary | ICD-10-CM | POA: Diagnosis not present

## 2017-06-24 DIAGNOSIS — L680 Hirsutism: Secondary | ICD-10-CM | POA: Diagnosis not present

## 2017-06-24 DIAGNOSIS — L859 Epidermal thickening, unspecified: Secondary | ICD-10-CM | POA: Diagnosis not present

## 2017-06-24 DIAGNOSIS — L918 Other hypertrophic disorders of the skin: Secondary | ICD-10-CM | POA: Diagnosis not present

## 2017-06-24 DIAGNOSIS — D2371 Other benign neoplasm of skin of right lower limb, including hip: Secondary | ICD-10-CM | POA: Diagnosis not present

## 2017-07-01 DIAGNOSIS — F4323 Adjustment disorder with mixed anxiety and depressed mood: Secondary | ICD-10-CM | POA: Diagnosis not present

## 2017-07-02 DIAGNOSIS — F4323 Adjustment disorder with mixed anxiety and depressed mood: Secondary | ICD-10-CM | POA: Diagnosis not present

## 2017-07-09 DIAGNOSIS — F4323 Adjustment disorder with mixed anxiety and depressed mood: Secondary | ICD-10-CM | POA: Diagnosis not present

## 2017-07-14 DIAGNOSIS — F4323 Adjustment disorder with mixed anxiety and depressed mood: Secondary | ICD-10-CM | POA: Diagnosis not present

## 2017-07-21 ENCOUNTER — Ambulatory Visit: Payer: BLUE CROSS/BLUE SHIELD | Admitting: Allergy and Immunology

## 2017-07-29 DIAGNOSIS — F4323 Adjustment disorder with mixed anxiety and depressed mood: Secondary | ICD-10-CM | POA: Diagnosis not present

## 2017-08-05 DIAGNOSIS — F4323 Adjustment disorder with mixed anxiety and depressed mood: Secondary | ICD-10-CM | POA: Diagnosis not present

## 2017-08-27 DIAGNOSIS — F4323 Adjustment disorder with mixed anxiety and depressed mood: Secondary | ICD-10-CM | POA: Diagnosis not present

## 2017-09-02 DIAGNOSIS — N760 Acute vaginitis: Secondary | ICD-10-CM | POA: Diagnosis not present

## 2017-09-02 DIAGNOSIS — Z6830 Body mass index (BMI) 30.0-30.9, adult: Secondary | ICD-10-CM | POA: Diagnosis not present

## 2017-09-02 DIAGNOSIS — Z113 Encounter for screening for infections with a predominantly sexual mode of transmission: Secondary | ICD-10-CM | POA: Diagnosis not present

## 2017-09-02 DIAGNOSIS — Z01419 Encounter for gynecological examination (general) (routine) without abnormal findings: Secondary | ICD-10-CM | POA: Diagnosis not present

## 2017-09-03 DIAGNOSIS — F4323 Adjustment disorder with mixed anxiety and depressed mood: Secondary | ICD-10-CM | POA: Diagnosis not present

## 2017-09-24 DIAGNOSIS — F4323 Adjustment disorder with mixed anxiety and depressed mood: Secondary | ICD-10-CM | POA: Diagnosis not present

## 2017-10-01 DIAGNOSIS — F4323 Adjustment disorder with mixed anxiety and depressed mood: Secondary | ICD-10-CM | POA: Diagnosis not present

## 2017-10-08 DIAGNOSIS — F4323 Adjustment disorder with mixed anxiety and depressed mood: Secondary | ICD-10-CM | POA: Diagnosis not present

## 2017-10-15 DIAGNOSIS — F4323 Adjustment disorder with mixed anxiety and depressed mood: Secondary | ICD-10-CM | POA: Diagnosis not present

## 2017-10-21 DIAGNOSIS — F4323 Adjustment disorder with mixed anxiety and depressed mood: Secondary | ICD-10-CM | POA: Diagnosis not present

## 2017-11-07 DIAGNOSIS — F4323 Adjustment disorder with mixed anxiety and depressed mood: Secondary | ICD-10-CM | POA: Diagnosis not present

## 2017-11-12 DIAGNOSIS — F4323 Adjustment disorder with mixed anxiety and depressed mood: Secondary | ICD-10-CM | POA: Diagnosis not present

## 2017-11-24 DIAGNOSIS — Z23 Encounter for immunization: Secondary | ICD-10-CM | POA: Diagnosis not present

## 2017-11-24 DIAGNOSIS — E282 Polycystic ovarian syndrome: Secondary | ICD-10-CM | POA: Diagnosis not present

## 2017-11-24 DIAGNOSIS — R7303 Prediabetes: Secondary | ICD-10-CM | POA: Diagnosis not present

## 2017-11-24 DIAGNOSIS — G4700 Insomnia, unspecified: Secondary | ICD-10-CM | POA: Diagnosis not present

## 2017-11-24 DIAGNOSIS — F439 Reaction to severe stress, unspecified: Secondary | ICD-10-CM | POA: Diagnosis not present

## 2017-11-24 DIAGNOSIS — R945 Abnormal results of liver function studies: Secondary | ICD-10-CM | POA: Diagnosis not present

## 2017-11-27 DIAGNOSIS — F4323 Adjustment disorder with mixed anxiety and depressed mood: Secondary | ICD-10-CM | POA: Diagnosis not present

## 2017-12-05 DIAGNOSIS — F4323 Adjustment disorder with mixed anxiety and depressed mood: Secondary | ICD-10-CM | POA: Diagnosis not present

## 2017-12-10 DIAGNOSIS — F4323 Adjustment disorder with mixed anxiety and depressed mood: Secondary | ICD-10-CM | POA: Diagnosis not present

## 2017-12-22 DIAGNOSIS — R6889 Other general symptoms and signs: Secondary | ICD-10-CM | POA: Diagnosis not present

## 2017-12-22 DIAGNOSIS — J029 Acute pharyngitis, unspecified: Secondary | ICD-10-CM | POA: Diagnosis not present

## 2017-12-24 DIAGNOSIS — F4323 Adjustment disorder with mixed anxiety and depressed mood: Secondary | ICD-10-CM | POA: Diagnosis not present

## 2018-01-06 DIAGNOSIS — F4323 Adjustment disorder with mixed anxiety and depressed mood: Secondary | ICD-10-CM | POA: Diagnosis not present

## 2018-01-29 DIAGNOSIS — M25572 Pain in left ankle and joints of left foot: Secondary | ICD-10-CM | POA: Diagnosis not present

## 2018-02-13 DIAGNOSIS — F4323 Adjustment disorder with mixed anxiety and depressed mood: Secondary | ICD-10-CM | POA: Diagnosis not present

## 2018-02-16 DIAGNOSIS — M25572 Pain in left ankle and joints of left foot: Secondary | ICD-10-CM | POA: Diagnosis not present

## 2018-02-17 DIAGNOSIS — S93402D Sprain of unspecified ligament of left ankle, subsequent encounter: Secondary | ICD-10-CM | POA: Diagnosis not present

## 2018-02-17 DIAGNOSIS — M25572 Pain in left ankle and joints of left foot: Secondary | ICD-10-CM | POA: Diagnosis not present

## 2018-02-17 DIAGNOSIS — M25672 Stiffness of left ankle, not elsewhere classified: Secondary | ICD-10-CM | POA: Diagnosis not present

## 2018-02-17 DIAGNOSIS — M6281 Muscle weakness (generalized): Secondary | ICD-10-CM | POA: Diagnosis not present

## 2018-03-20 DIAGNOSIS — F4323 Adjustment disorder with mixed anxiety and depressed mood: Secondary | ICD-10-CM | POA: Diagnosis not present

## 2018-04-03 DIAGNOSIS — F4323 Adjustment disorder with mixed anxiety and depressed mood: Secondary | ICD-10-CM | POA: Diagnosis not present

## 2018-04-06 DIAGNOSIS — M9905 Segmental and somatic dysfunction of pelvic region: Secondary | ICD-10-CM | POA: Diagnosis not present

## 2018-04-06 DIAGNOSIS — M9901 Segmental and somatic dysfunction of cervical region: Secondary | ICD-10-CM | POA: Diagnosis not present

## 2018-04-06 DIAGNOSIS — M9902 Segmental and somatic dysfunction of thoracic region: Secondary | ICD-10-CM | POA: Diagnosis not present

## 2018-04-06 DIAGNOSIS — M9903 Segmental and somatic dysfunction of lumbar region: Secondary | ICD-10-CM | POA: Diagnosis not present

## 2018-04-08 DIAGNOSIS — F4323 Adjustment disorder with mixed anxiety and depressed mood: Secondary | ICD-10-CM | POA: Diagnosis not present

## 2018-04-13 DIAGNOSIS — M9905 Segmental and somatic dysfunction of pelvic region: Secondary | ICD-10-CM | POA: Diagnosis not present

## 2018-04-13 DIAGNOSIS — M9902 Segmental and somatic dysfunction of thoracic region: Secondary | ICD-10-CM | POA: Diagnosis not present

## 2018-04-13 DIAGNOSIS — M9901 Segmental and somatic dysfunction of cervical region: Secondary | ICD-10-CM | POA: Diagnosis not present

## 2018-04-13 DIAGNOSIS — M9903 Segmental and somatic dysfunction of lumbar region: Secondary | ICD-10-CM | POA: Diagnosis not present

## 2018-04-20 DIAGNOSIS — M9905 Segmental and somatic dysfunction of pelvic region: Secondary | ICD-10-CM | POA: Diagnosis not present

## 2018-04-20 DIAGNOSIS — M9903 Segmental and somatic dysfunction of lumbar region: Secondary | ICD-10-CM | POA: Diagnosis not present

## 2018-04-20 DIAGNOSIS — M9901 Segmental and somatic dysfunction of cervical region: Secondary | ICD-10-CM | POA: Diagnosis not present

## 2018-04-20 DIAGNOSIS — M9902 Segmental and somatic dysfunction of thoracic region: Secondary | ICD-10-CM | POA: Diagnosis not present

## 2018-05-26 DIAGNOSIS — B001 Herpesviral vesicular dermatitis: Secondary | ICD-10-CM | POA: Diagnosis not present

## 2018-05-26 DIAGNOSIS — F439 Reaction to severe stress, unspecified: Secondary | ICD-10-CM | POA: Diagnosis not present

## 2018-05-26 DIAGNOSIS — R7303 Prediabetes: Secondary | ICD-10-CM | POA: Diagnosis not present

## 2018-05-26 DIAGNOSIS — G4700 Insomnia, unspecified: Secondary | ICD-10-CM | POA: Diagnosis not present

## 2018-06-27 DIAGNOSIS — J020 Streptococcal pharyngitis: Secondary | ICD-10-CM | POA: Diagnosis not present

## 2018-07-06 DIAGNOSIS — F4323 Adjustment disorder with mixed anxiety and depressed mood: Secondary | ICD-10-CM | POA: Diagnosis not present

## 2018-07-31 DIAGNOSIS — F4323 Adjustment disorder with mixed anxiety and depressed mood: Secondary | ICD-10-CM | POA: Diagnosis not present

## 2018-08-07 DIAGNOSIS — F4323 Adjustment disorder with mixed anxiety and depressed mood: Secondary | ICD-10-CM | POA: Diagnosis not present

## 2018-08-14 DIAGNOSIS — F4323 Adjustment disorder with mixed anxiety and depressed mood: Secondary | ICD-10-CM | POA: Diagnosis not present

## 2018-08-21 DIAGNOSIS — F4323 Adjustment disorder with mixed anxiety and depressed mood: Secondary | ICD-10-CM | POA: Diagnosis not present

## 2018-09-11 DIAGNOSIS — F4323 Adjustment disorder with mixed anxiety and depressed mood: Secondary | ICD-10-CM | POA: Diagnosis not present

## 2018-09-18 DIAGNOSIS — F4323 Adjustment disorder with mixed anxiety and depressed mood: Secondary | ICD-10-CM | POA: Diagnosis not present

## 2018-09-25 DIAGNOSIS — F4323 Adjustment disorder with mixed anxiety and depressed mood: Secondary | ICD-10-CM | POA: Diagnosis not present

## 2018-09-30 DIAGNOSIS — F4323 Adjustment disorder with mixed anxiety and depressed mood: Secondary | ICD-10-CM | POA: Diagnosis not present

## 2018-10-05 DIAGNOSIS — N760 Acute vaginitis: Secondary | ICD-10-CM | POA: Diagnosis not present

## 2018-10-05 DIAGNOSIS — Z6831 Body mass index (BMI) 31.0-31.9, adult: Secondary | ICD-10-CM | POA: Diagnosis not present

## 2018-10-05 DIAGNOSIS — Z113 Encounter for screening for infections with a predominantly sexual mode of transmission: Secondary | ICD-10-CM | POA: Diagnosis not present

## 2018-10-05 DIAGNOSIS — Z01419 Encounter for gynecological examination (general) (routine) without abnormal findings: Secondary | ICD-10-CM | POA: Diagnosis not present

## 2018-10-08 DIAGNOSIS — F4323 Adjustment disorder with mixed anxiety and depressed mood: Secondary | ICD-10-CM | POA: Diagnosis not present

## 2018-10-15 DIAGNOSIS — F4323 Adjustment disorder with mixed anxiety and depressed mood: Secondary | ICD-10-CM | POA: Diagnosis not present

## 2018-10-22 DIAGNOSIS — F4323 Adjustment disorder with mixed anxiety and depressed mood: Secondary | ICD-10-CM | POA: Diagnosis not present

## 2018-10-29 DIAGNOSIS — F4323 Adjustment disorder with mixed anxiety and depressed mood: Secondary | ICD-10-CM | POA: Diagnosis not present

## 2018-11-06 DIAGNOSIS — F4323 Adjustment disorder with mixed anxiety and depressed mood: Secondary | ICD-10-CM | POA: Diagnosis not present

## 2018-11-13 DIAGNOSIS — F4323 Adjustment disorder with mixed anxiety and depressed mood: Secondary | ICD-10-CM | POA: Diagnosis not present

## 2018-11-19 DIAGNOSIS — F4323 Adjustment disorder with mixed anxiety and depressed mood: Secondary | ICD-10-CM | POA: Diagnosis not present

## 2018-11-26 DIAGNOSIS — F4323 Adjustment disorder with mixed anxiety and depressed mood: Secondary | ICD-10-CM | POA: Diagnosis not present

## 2018-12-03 DIAGNOSIS — F4323 Adjustment disorder with mixed anxiety and depressed mood: Secondary | ICD-10-CM | POA: Diagnosis not present

## 2018-12-09 DIAGNOSIS — F4323 Adjustment disorder with mixed anxiety and depressed mood: Secondary | ICD-10-CM | POA: Diagnosis not present

## 2018-12-16 DIAGNOSIS — F4323 Adjustment disorder with mixed anxiety and depressed mood: Secondary | ICD-10-CM | POA: Diagnosis not present

## 2018-12-23 DIAGNOSIS — F4323 Adjustment disorder with mixed anxiety and depressed mood: Secondary | ICD-10-CM | POA: Diagnosis not present

## 2019-01-06 DIAGNOSIS — F4323 Adjustment disorder with mixed anxiety and depressed mood: Secondary | ICD-10-CM | POA: Diagnosis not present

## 2019-01-20 DIAGNOSIS — Z20828 Contact with and (suspected) exposure to other viral communicable diseases: Secondary | ICD-10-CM | POA: Diagnosis not present

## 2019-01-20 DIAGNOSIS — J029 Acute pharyngitis, unspecified: Secondary | ICD-10-CM | POA: Diagnosis not present

## 2019-01-25 DIAGNOSIS — U071 COVID-19: Secondary | ICD-10-CM | POA: Diagnosis not present

## 2019-01-25 DIAGNOSIS — R05 Cough: Secondary | ICD-10-CM | POA: Diagnosis not present

## 2019-06-02 IMAGING — CR DG CHEST 2V
2 series · 2 of 2 positions shown · non-contrast
Comparison: None.

CLINICAL DATA: Cough for 1 month

EXAM:
CHEST - 2 VIEW

[w chest pa]
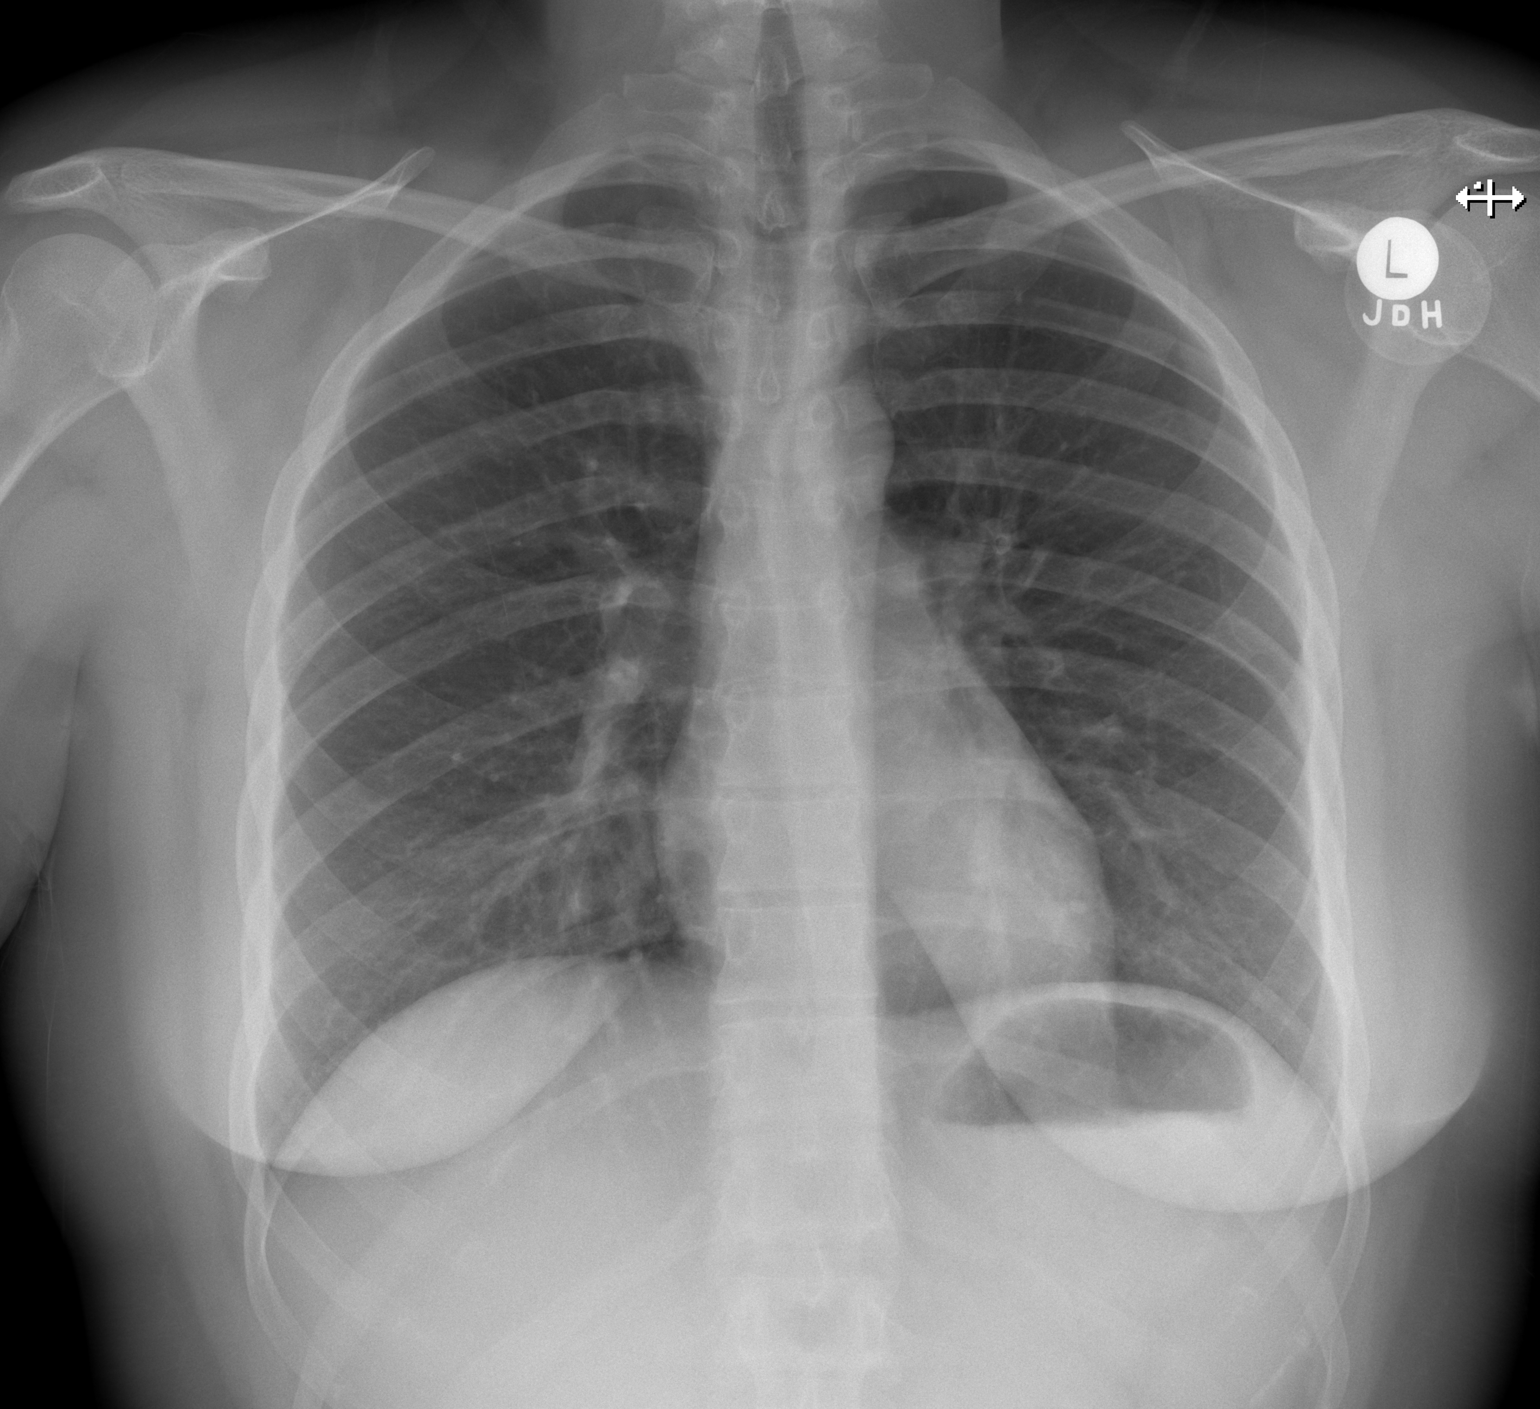

[w chest lat]
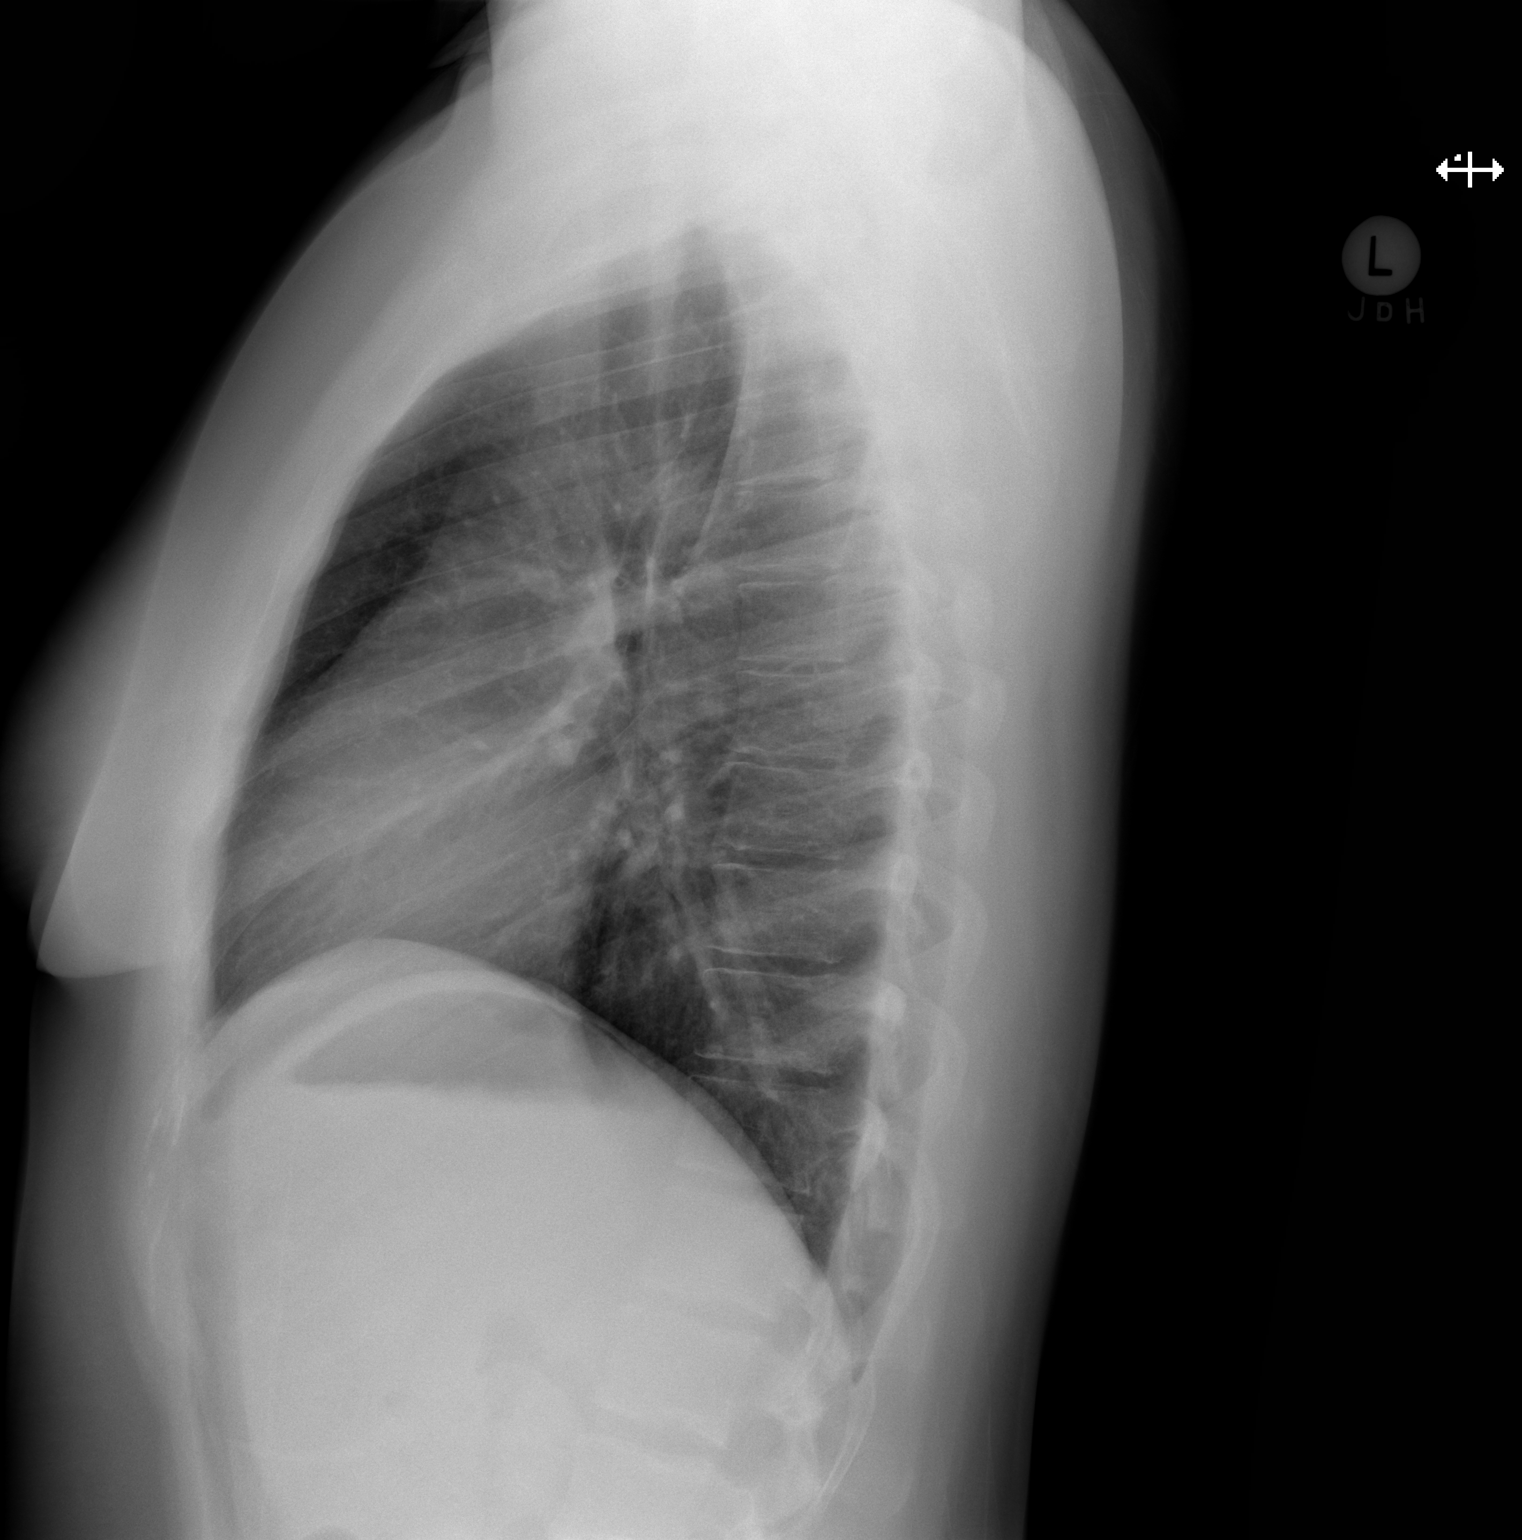

[2 of 2 positions shown; findings below may reference images not displayed]

FINDINGS: The heart size and mediastinal contours are within normal limits.
Both lungs are clear. The visualized skeletal structures are
unremarkable.
IMPRESSION: No active cardiopulmonary disease.

## 2021-03-13 DIAGNOSIS — R69 Illness, unspecified: Secondary | ICD-10-CM | POA: Diagnosis not present

## 2021-03-13 DIAGNOSIS — Z124 Encounter for screening for malignant neoplasm of cervix: Secondary | ICD-10-CM | POA: Diagnosis not present

## 2021-03-13 DIAGNOSIS — Z6838 Body mass index (BMI) 38.0-38.9, adult: Secondary | ICD-10-CM | POA: Diagnosis not present

## 2021-03-13 DIAGNOSIS — Z113 Encounter for screening for infections with a predominantly sexual mode of transmission: Secondary | ICD-10-CM | POA: Diagnosis not present

## 2021-03-13 DIAGNOSIS — Z01419 Encounter for gynecological examination (general) (routine) without abnormal findings: Secondary | ICD-10-CM | POA: Diagnosis not present

## 2021-05-28 DIAGNOSIS — N72 Inflammatory disease of cervix uteri: Secondary | ICD-10-CM | POA: Diagnosis not present

## 2021-05-28 DIAGNOSIS — Z309 Encounter for contraceptive management, unspecified: Secondary | ICD-10-CM | POA: Diagnosis not present

## 2021-05-28 DIAGNOSIS — L680 Hirsutism: Secondary | ICD-10-CM | POA: Diagnosis not present

## 2021-05-28 DIAGNOSIS — N760 Acute vaginitis: Secondary | ICD-10-CM | POA: Diagnosis not present

## 2021-07-16 DIAGNOSIS — R69 Illness, unspecified: Secondary | ICD-10-CM | POA: Diagnosis not present

## 2021-10-23 ENCOUNTER — Ambulatory Visit
Admission: EM | Admit: 2021-10-23 | Discharge: 2021-10-23 | Disposition: A | Discharge status: Home / Self Care | Payer: 59 | Attending: Family Medicine | Admitting: Family Medicine

## 2021-10-23 DIAGNOSIS — R509 Fever, unspecified: Secondary | ICD-10-CM | POA: Diagnosis not present

## 2021-10-23 DIAGNOSIS — Z20822 Contact with and (suspected) exposure to covid-19: Secondary | ICD-10-CM | POA: Diagnosis not present

## 2021-10-23 DIAGNOSIS — R519 Headache, unspecified: Secondary | ICD-10-CM | POA: Insufficient documentation

## 2021-10-23 LAB — POCT URINALYSIS DIP (MANUAL ENTRY)
Bilirubin, UA: NEGATIVE
Blood, UA: NEGATIVE
Glucose, UA: NEGATIVE mg/dL
Ketones, POC UA: NEGATIVE mg/dL
Leukocytes, UA: NEGATIVE
Nitrite, UA: NEGATIVE
Protein Ur, POC: 30 mg/dL — AB
Spec Grav, UA: 1.025 (ref 1.010–1.025)
Urobilinogen, UA: 0.2 E.U./dL
pH, UA: 5.5 (ref 5.0–8.0)

## 2021-10-23 NOTE — ED Provider Notes (Signed)
[facility]    CSN: [id number] Arrival date & time: 10/23/21  1859      History   Chief Complaint Chief Complaint  Patient presents with   Fever   Headache   Emesis   Diarrhea    HPI Melissa Sherman is a 31 y.o. female.   HPI 31 year old female presents with headache, fever chills for 2 days.  Patient reports taking Tylenol roughly 1.5 hours ago.  Patient was evaluated by [facility] yesterday Monday, 10/22/2021 and was prescribed Tamiflu and Tessalon Perles.  PMH significant for obesity and history of PCOS.  Patient is accompanied by her husband this evening.  Patient reports only taking 2 doses of Tamiflu as this caused her to become sick to her stomach.  Past Medical History:  Diagnosis Date   History of PCOS     Patient Active Problem List   Diagnosis Date Noted   Indication for care in labor or delivery 04/08/2016   Pregnancy 04/08/2016    Past Surgical History:  Procedure Laterality Date   DENTAL SURGERY     KNEE SURGERY      OB History     Gravida  1   Para  1   Term  1   Preterm      AB      Living  1      SAB      IAB      Ectopic      Multiple  0   Live Births  1            Home Medications    Prior to Admission medications   Medication Sig Start Date End Date Taking? Authorizing Provider  acetaminophen (TYLENOL) 500 MG tablet Take 1 tablet (500 mg total) by mouth every 6 (six) hours as needed. 04/10/16   Tyson Dense, MD  calcium carbonate (TUMS - DOSED IN MG ELEMENTAL CALCIUM) 500 MG chewable tablet Chew 1-2 tablets by mouth daily.    [provider]  docusate sodium (COLACE) 100 MG capsule Take 1 capsule (100 mg total) by mouth 2 (two) times daily. 04/10/16   Tyson Dense, MD  ferrous sulfate 325 (65 FE) MG EC tablet Take 1 tablet (325 mg total) by mouth 2 (two) times daily. 04/10/16   Tyson Dense, MD  oxyCODONE (OXY IR/ROXICODONE) 5 MG immediate release tablet Take 1  tablet (5 mg total) by mouth every 4 (four) hours as needed for severe pain. 04/10/16   Tyson Dense, MD  spironolactone (ALDACTONE) 50 MG tablet Take 50 mg by mouth daily. 10/23/21   [provider]    Family History History reviewed. No pertinent family history.  Social History Social History   Tobacco Use   Smoking status: Never   Smokeless tobacco: Never  Substance Use Topics   Alcohol use: No   Drug use: No     Allergies   Nsaids, Diphenhydramine, and Penicillins   Review of Systems Review of Systems  Constitutional:  Positive for chills and fever.  Respiratory:  Positive for cough.   Neurological:  Positive for headaches.  All other systems reviewed and are negative.    Physical Exam Triage Vital Signs ED Triage Vitals  Enc Vitals Group     BP      Pulse      Resp      Temp      Temp src      SpO2  Weight      Height      Head Circumference      Peak Flow      Pain Score      Pain Loc      Pain Edu?      Excl. in GC?    No data found.  Updated Vital Signs BP 118/82 (BP Location: Right Arm)   Pulse 75   Temp 98.9 F (37.2 C) (Oral)   Resp 18   SpO2 98%      Physical Exam Vitals and nursing note reviewed.  Constitutional:      General: She is not in acute distress.    Appearance: Normal appearance. She is obese. She is ill-appearing.  HENT:     Head: Normocephalic and atraumatic.     Right Ear: Tympanic membrane, ear canal and external ear normal.     Left Ear: Tympanic membrane, ear canal and external ear normal.     Mouth/Throat:     Mouth: Mucous membranes are moist.     Pharynx: Oropharynx is clear.  Eyes:     Extraocular Movements: Extraocular movements intact.     Conjunctiva/sclera: Conjunctivae normal.     Pupils: Pupils are equal, round, and reactive to light.  Cardiovascular:     Rate and Rhythm: Normal rate and regular rhythm.     Pulses: Normal pulses.     Heart sounds: Normal heart sounds.   Pulmonary:     Effort: Pulmonary effort is normal.     Breath sounds: Normal breath sounds. No wheezing, rhonchi or rales.  Musculoskeletal:        General: Normal range of motion.     Cervical back: Normal range of motion and neck supple.  Skin:    General: Skin is warm and dry.  Neurological:     General: No focal deficit present.     Mental Status: She is alert and oriented to person, place, and time.      UC Treatments / Results  Labs (all labs ordered are listed, but only abnormal results are displayed) Labs Reviewed  POCT URINALYSIS DIP (MANUAL ENTRY) - Abnormal; Notable for the following components:      Result Value   Protein Ur, POC =30 (*)    All other components within normal limits  RESP PANEL BY RT-PCR (FLU A&B, COVID) [id number]    EKG   Radiology No results found.  Procedures Procedures (including critical care time)  Medications Ordered in UC Medications - No data to display  Initial Impression / Assessment and Plan / UC Course  I have reviewed the triage vital signs and the nursing notes.  Pertinent labs & imaging results that were available during my care of the patient were reviewed by me and considered in my medical decision making (see chart for details).     MDM: 1.  Fever-lab [id number] ordered. Advised patient may take OTC Tylenol 1000 to 4000 mg daily, as needed for fever.  Encouraged patient to increase daily water intake to 64 ounces per day advised may push fluids with Gatorade G2 for cardiac electrolyte replacement.  Advised patient may take OTC extra strength Excedrin 1-2 tabs daily for headache pain.  Advised patient we will follow-up with COVID-19/influenza results once received.  Advised patient if symptoms worsen and/or unresolved please follow-up with PCP or here for further evaluation.  Discharged home, hemodynamically stable. Final Clinical Impressions(s) / UC Diagnoses   Final diagnoses:  Fever, unspecified     Discharge  Instructions       Advised patient may take OTC Tylenol 1000 to 4000 mg daily, as needed for fever.  Encouraged patient to increase daily water intake to 64 ounces per day advised may push fluids with Gatorade G2 for cardiac electrolyte replacement.  Advised patient may take OTC extra strength Excedrin 1-2 tabs daily for headache pain.  Advised patient we will follow-up with COVID-19/influenza results once received.  Advised patient if symptoms worsen and/or unresolved please follow-up with PCP or here for further evaluation.     ED Prescriptions   None    PDMP not reviewed this encounter.   Eliezer Lofts, Girard 10/23/21 1940

## 2021-10-23 NOTE — ED Triage Notes (Signed)
Pt c/o fever, headache, emesis and diarrhea since Sunday. Also feeling weak. Tmax fever 103 this am. Tylenol prn. Last dose 1 hr ago.

## 2021-10-23 NOTE — Discharge Instructions (Addendum)
Advised patient may take OTC Tylenol 1000 to 4000 mg daily, as needed for fever.  Encouraged patient to increase daily water intake to 64 ounces per day advised may push fluids with Gatorade G2 for cardiac electrolyte replacement.  Advised patient may take OTC extra strength Excedrin 1-2 tabs daily for headache pain.  Advised patient we will follow-up with COVID-19/influenza results once received.  Advised patient if symptoms worsen and/or unresolved please follow-up with PCP or here for further evaluation.

## 2021-10-24 ENCOUNTER — Telehealth: Payer: Self-pay | Admitting: Emergency Medicine

## 2021-10-24 LAB — RESP PANEL BY RT-PCR (FLU A&B, COVID) ARPGX2
Influenza A by PCR: NEGATIVE
Influenza B by PCR: NEGATIVE
SARS Coronavirus 2 by RT PCR: NEGATIVE

## 2022-02-25 DIAGNOSIS — E6609 Other obesity due to excess calories: Secondary | ICD-10-CM | POA: Diagnosis not present

## 2022-02-25 DIAGNOSIS — R69 Illness, unspecified: Secondary | ICD-10-CM | POA: Diagnosis not present

## 2022-03-26 DIAGNOSIS — R69 Illness, unspecified: Secondary | ICD-10-CM | POA: Diagnosis not present

## 2022-03-26 DIAGNOSIS — E6609 Other obesity due to excess calories: Secondary | ICD-10-CM | POA: Diagnosis not present

## 2022-05-03 DIAGNOSIS — E6609 Other obesity due to excess calories: Secondary | ICD-10-CM | POA: Diagnosis not present

## 2022-05-03 DIAGNOSIS — F900 Attention-deficit hyperactivity disorder, predominantly inattentive type: Secondary | ICD-10-CM | POA: Diagnosis not present

## 2022-06-24 DIAGNOSIS — Z1151 Encounter for screening for human papillomavirus (HPV): Secondary | ICD-10-CM | POA: Diagnosis not present

## 2022-06-24 DIAGNOSIS — Z124 Encounter for screening for malignant neoplasm of cervix: Secondary | ICD-10-CM | POA: Diagnosis not present

## 2022-06-24 DIAGNOSIS — Z30432 Encounter for removal of intrauterine contraceptive device: Secondary | ICD-10-CM | POA: Diagnosis not present

## 2022-06-24 DIAGNOSIS — Z01419 Encounter for gynecological examination (general) (routine) without abnormal findings: Secondary | ICD-10-CM | POA: Diagnosis not present

## 2022-06-24 DIAGNOSIS — N898 Other specified noninflammatory disorders of vagina: Secondary | ICD-10-CM | POA: Diagnosis not present

## 2022-06-24 DIAGNOSIS — E282 Polycystic ovarian syndrome: Secondary | ICD-10-CM | POA: Diagnosis not present

## 2022-06-24 DIAGNOSIS — Z113 Encounter for screening for infections with a predominantly sexual mode of transmission: Secondary | ICD-10-CM | POA: Diagnosis not present

## 2022-06-27 DIAGNOSIS — E6609 Other obesity due to excess calories: Secondary | ICD-10-CM | POA: Diagnosis not present

## 2022-06-27 DIAGNOSIS — F900 Attention-deficit hyperactivity disorder, predominantly inattentive type: Secondary | ICD-10-CM | POA: Diagnosis not present

## 2022-09-12 DIAGNOSIS — E6609 Other obesity due to excess calories: Secondary | ICD-10-CM | POA: Diagnosis not present

## 2022-09-12 DIAGNOSIS — F900 Attention-deficit hyperactivity disorder, predominantly inattentive type: Secondary | ICD-10-CM | POA: Diagnosis not present

## 2022-12-14 DIAGNOSIS — E6609 Other obesity due to excess calories: Secondary | ICD-10-CM | POA: Diagnosis not present

## 2022-12-14 DIAGNOSIS — F900 Attention-deficit hyperactivity disorder, predominantly inattentive type: Secondary | ICD-10-CM | POA: Diagnosis not present

## 2023-05-26 DIAGNOSIS — R220 Localized swelling, mass and lump, head: Secondary | ICD-10-CM | POA: Diagnosis not present

## 2023-05-26 DIAGNOSIS — S00521A Blister (nonthermal) of lip, initial encounter: Secondary | ICD-10-CM | POA: Diagnosis not present

## 2023-05-30 DIAGNOSIS — N912 Amenorrhea, unspecified: Secondary | ICD-10-CM | POA: Diagnosis not present

## 2023-07-23 DIAGNOSIS — F900 Attention-deficit hyperactivity disorder, predominantly inattentive type: Secondary | ICD-10-CM | POA: Diagnosis not present

## 2023-08-04 DIAGNOSIS — Z30431 Encounter for routine checking of intrauterine contraceptive device: Secondary | ICD-10-CM | POA: Diagnosis not present

## 2023-08-04 DIAGNOSIS — N898 Other specified noninflammatory disorders of vagina: Secondary | ICD-10-CM | POA: Diagnosis not present

## 2023-08-04 DIAGNOSIS — Z01419 Encounter for gynecological examination (general) (routine) without abnormal findings: Secondary | ICD-10-CM | POA: Diagnosis not present

## 2023-10-20 DIAGNOSIS — F909 Attention-deficit hyperactivity disorder, unspecified type: Secondary | ICD-10-CM | POA: Diagnosis not present

## 2023-12-22 DIAGNOSIS — E282 Polycystic ovarian syndrome: Secondary | ICD-10-CM | POA: Diagnosis not present

## 2023-12-22 DIAGNOSIS — R7303 Prediabetes: Secondary | ICD-10-CM | POA: Diagnosis not present

## 2023-12-22 DIAGNOSIS — F439 Reaction to severe stress, unspecified: Secondary | ICD-10-CM | POA: Diagnosis not present

## 2023-12-22 DIAGNOSIS — F909 Attention-deficit hyperactivity disorder, unspecified type: Secondary | ICD-10-CM | POA: Diagnosis not present

## 2023-12-22 DIAGNOSIS — L680 Hirsutism: Secondary | ICD-10-CM | POA: Diagnosis not present

## 2023-12-22 DIAGNOSIS — B001 Herpesviral vesicular dermatitis: Secondary | ICD-10-CM | POA: Diagnosis not present

## 2023-12-22 DIAGNOSIS — H9202 Otalgia, left ear: Secondary | ICD-10-CM | POA: Diagnosis not present

## 2023-12-22 DIAGNOSIS — Z0000 Encounter for general adult medical examination without abnormal findings: Secondary | ICD-10-CM | POA: Diagnosis not present

## 2023-12-22 DIAGNOSIS — G4700 Insomnia, unspecified: Secondary | ICD-10-CM | POA: Diagnosis not present

## 2023-12-22 DIAGNOSIS — Z6839 Body mass index (BMI) 39.0-39.9, adult: Secondary | ICD-10-CM | POA: Diagnosis not present
